# Patient Record
Sex: Male | Born: 1960 | Race: White | Hispanic: No | Marital: Married | Smoking: Current every day smoker
Health system: Southern US, Community
[De-identification: ages and names within clinical notes are randomized; demographics above are authoritative.]

## PROBLEM LIST (undated history)

## (undated) DIAGNOSIS — F32A Depression, unspecified: Secondary | ICD-10-CM

## (undated) DIAGNOSIS — F329 Major depressive disorder, single episode, unspecified: Secondary | ICD-10-CM

## (undated) DIAGNOSIS — F419 Anxiety disorder, unspecified: Secondary | ICD-10-CM

## (undated) DIAGNOSIS — K5792 Diverticulitis of intestine, part unspecified, without perforation or abscess without bleeding: Secondary | ICD-10-CM

## (undated) HISTORY — DX: Major depressive disorder, single episode, unspecified: F32.9

## (undated) HISTORY — PX: WISDOM TOOTH EXTRACTION: SHX21

## (undated) HISTORY — DX: Depression, unspecified: F32.A

## (undated) HISTORY — DX: Anxiety disorder, unspecified: F41.9

---

## 2008-10-12 ENCOUNTER — Emergency Department: Payer: Self-pay | Admitting: Emergency Medicine

## 2012-01-17 DIAGNOSIS — M542 Cervicalgia: Secondary | ICD-10-CM | POA: Insufficient documentation

## 2012-01-17 DIAGNOSIS — G4700 Insomnia, unspecified: Secondary | ICD-10-CM | POA: Insufficient documentation

## 2014-07-15 DIAGNOSIS — Z87891 Personal history of nicotine dependence: Secondary | ICD-10-CM | POA: Insufficient documentation

## 2014-07-15 DIAGNOSIS — F32A Depression, unspecified: Secondary | ICD-10-CM | POA: Insufficient documentation

## 2014-07-15 DIAGNOSIS — F329 Major depressive disorder, single episode, unspecified: Secondary | ICD-10-CM | POA: Insufficient documentation

## 2015-03-31 ENCOUNTER — Other Ambulatory Visit: Payer: Self-pay | Admitting: Family Medicine

## 2015-03-31 DIAGNOSIS — R1084 Generalized abdominal pain: Secondary | ICD-10-CM

## 2015-03-31 DIAGNOSIS — K529 Noninfective gastroenteritis and colitis, unspecified: Secondary | ICD-10-CM

## 2015-04-02 ENCOUNTER — Ambulatory Visit
Admission: RE | Admit: 2015-04-02 | Discharge: 2015-04-02 | Disposition: A | Discharge status: Home / Self Care | Payer: Managed Care, Other (non HMO) | Source: Ambulatory Visit | Attending: Family Medicine | Admitting: Family Medicine

## 2015-04-02 DIAGNOSIS — K529 Noninfective gastroenteritis and colitis, unspecified: Secondary | ICD-10-CM

## 2015-04-02 DIAGNOSIS — R1084 Generalized abdominal pain: Secondary | ICD-10-CM

## 2015-04-07 ENCOUNTER — Ambulatory Visit
Admission: RE | Admit: 2015-04-07 | Discharge: 2015-04-07 | Disposition: A | Discharge status: Home / Self Care | Payer: Managed Care, Other (non HMO) | Source: Ambulatory Visit | Attending: Family Medicine | Admitting: Family Medicine

## 2015-04-07 DIAGNOSIS — R1084 Generalized abdominal pain: Secondary | ICD-10-CM | POA: Diagnosis not present

## 2015-04-07 DIAGNOSIS — R197 Diarrhea, unspecified: Secondary | ICD-10-CM | POA: Diagnosis present

## 2016-04-06 ENCOUNTER — Encounter: Payer: Self-pay | Admitting: Emergency Medicine

## 2016-04-06 ENCOUNTER — Ambulatory Visit
Admission: EM | Admit: 2016-04-06 | Discharge: 2016-04-06 | Disposition: A | Discharge status: Home / Self Care | Payer: Managed Care, Other (non HMO) | Attending: Family Medicine | Admitting: Family Medicine

## 2016-04-06 ENCOUNTER — Ambulatory Visit (INDEPENDENT_AMBULATORY_CARE_PROVIDER_SITE_OTHER): Payer: Managed Care, Other (non HMO)

## 2016-04-06 DIAGNOSIS — Z8719 Personal history of other diseases of the digestive system: Secondary | ICD-10-CM

## 2016-04-06 DIAGNOSIS — R1030 Lower abdominal pain, unspecified: Secondary | ICD-10-CM

## 2016-04-06 HISTORY — DX: Diverticulitis of intestine, part unspecified, without perforation or abscess without bleeding: K57.92

## 2016-04-06 LAB — COMPREHENSIVE METABOLIC PANEL
ALT: 15 U/L — AB (ref 17–63)
AST: 14 U/L — AB (ref 15–41)
Albumin: 4.5 g/dL (ref 3.5–5.0)
Alkaline Phosphatase: 46 U/L (ref 38–126)
Anion gap: 7 (ref 5–15)
BILIRUBIN TOTAL: 1.1 mg/dL (ref 0.3–1.2)
BUN: 18 mg/dL (ref 6–20)
CO2: 22 mmol/L (ref 22–32)
CREATININE: 0.86 mg/dL (ref 0.61–1.24)
Calcium: 9.2 mg/dL (ref 8.9–10.3)
Chloride: 107 mmol/L (ref 101–111)
Glucose, Bld: 96 mg/dL (ref 65–99)
Potassium: 4 mmol/L (ref 3.5–5.1)
Sodium: 136 mmol/L (ref 135–145)
TOTAL PROTEIN: 7 g/dL (ref 6.5–8.1)

## 2016-04-06 LAB — CBC WITH DIFFERENTIAL/PLATELET
BASOS ABS: 0.1 10*3/uL (ref 0–0.1)
BASOS PCT: 1 %
EOS ABS: 0.2 10*3/uL (ref 0–0.7)
EOS PCT: 2 %
HCT: 48.9 % (ref 40.0–52.0)
HEMOGLOBIN: 16.8 g/dL (ref 13.0–18.0)
Lymphocytes Relative: 23 %
Lymphs Abs: 2.4 10*3/uL (ref 1.0–3.6)
MCH: 31.4 pg (ref 26.0–34.0)
MCHC: 34.4 g/dL (ref 32.0–36.0)
MCV: 91.3 fL (ref 80.0–100.0)
Monocytes Absolute: 0.8 10*3/uL (ref 0.2–1.0)
Monocytes Relative: 8 %
NEUTROS PCT: 66 %
Neutro Abs: 7 10*3/uL — ABNORMAL HIGH (ref 1.4–6.5)
PLATELETS: 152 10*3/uL (ref 150–440)
RBC: 5.36 MIL/uL (ref 4.40–5.90)
RDW: 13.1 % (ref 11.5–14.5)
WBC: 10.5 10*3/uL (ref 3.8–10.6)

## 2016-04-06 LAB — AMYLASE: AMYLASE: 39 U/L (ref 28–100)

## 2016-04-06 LAB — LIPASE, BLOOD: LIPASE: 25 U/L (ref 11–51)

## 2016-04-06 MED ORDER — CIPROFLOXACIN HCL 500 MG PO TABS: 500.0000 mg | ORAL_TABLET | Freq: Two times a day (BID) | ORAL | 0 refills | Status: DC

## 2016-04-06 MED ORDER — METRONIDAZOLE 500 MG PO TABS: 500.0000 mg | ORAL_TABLET | Freq: Two times a day (BID) | ORAL | 0 refills | Status: DC

## 2016-04-06 NOTE — ED Provider Notes (Signed)
[facility]    CSN: [id number] Arrival date & time: 04/06/16  8295     History   Chief Complaint Chief Complaint  Patient presents with  . Abdominal Pain    HPI Alexander Berg is a 55 y.o. male.   Patient is here because of history of abdominal pain started on Monday. States that by Wednesday he thought that things were improving by Thursday he had a relapse. He was driving this morning to work and felt increased pain and discomfort and came here. He states that about a 1-2 years ago he was diagnosed with diverticulitis. He's had MRIs of the imaging studies of the abdomen and a colonoscopy and that was the conclusion given. He has been on Cipro and Flagyl before combined in the past but states the most at times and acute diverticulitis clears on his own if he changes his diet and eats Poss and soft foods for a while. He tried that this time it did not work. Today he's had numerous numbers of stools and increased pain. He states the pain has gotten better since arrival here and he is sitting. He is allergic to melons he does smoke. His father died of pancreatic cancer.   The history is provided by the patient. No language interpreter was used.  Abdominal Pain  Pain location:  Suprapubic and LLQ Pain quality: aching, cramping, pressure and sharp   Pain radiates to:  Does not radiate Pain severity:  Moderate Duration:  4 days Timing:  Constant Progression:  Worsening Context: diet changes and recent illness   Relieved by:  Nothing Ineffective treatments: dietary changes. Associated symptoms: diarrhea   Associated symptoms: no fever     Past Medical History:  Diagnosis Date  . Diverticulitis     There are no active problems to display for this patient.   No past surgical history on file.     Home Medications    Prior to Admission medications   Medication Sig Start Date End Date Taking? Authorizing Provider  ciprofloxacin (CIPRO) 500 MG tablet Take 1  tablet (500 mg total) by mouth 2 (two) times daily. 04/06/16   Hassan Rowan, MD  metroNIDAZOLE (FLAGYL) 500 MG tablet Take 1 tablet (500 mg total) by mouth 2 (two) times daily. 04/06/16   Hassan Rowan, MD    Family History History reviewed. No pertinent family history.  Social History Social History  Substance Use Topics  . Smoking status: Current Every Day Smoker    Packs/day: 0.50    Types: Cigarettes  . Smokeless tobacco: Never Used  . Alcohol use Yes     Comment: occassional      Allergies   Review of patient's allergies indicates no known allergies.   Review of Systems Review of Systems  Constitutional: Negative for fever.  Gastrointestinal: Positive for abdominal pain and diarrhea.  All other systems reviewed and are negative.    Physical Exam Triage Vital Signs ED Triage Vitals [04/06/16 0932]  Enc Vitals Group     BP 128/79     Pulse Rate 69     Resp 16     Temp 97.7 F (36.5 C)     Temp Source Oral     SpO2 98 %     Weight 235 lb (106.6 kg)     Height 6\' 2"  (1.88 m)     Head Circumference      Peak Flow      Pain Score      Pain Loc  Pain Edu?      Excl. in GC?    No data found.   Updated Vital Signs BP 128/79 (BP Location: Right Arm)   Pulse 69   Temp 97.7 F (36.5 C) (Oral)   Resp 16   Ht 6\' 2"  (1.88 m)   Wt 235 lb (106.6 kg)   SpO2 98%   BMI 30.17 kg/m   Visual Acuity Right Eye Distance:   Left Eye Distance:   Bilateral Distance:    Right Eye Near:   Left Eye Near:    Bilateral Near:     Physical Exam  Constitutional: He is oriented to person, place, and time. He appears well-developed and well-nourished.  HENT:  Head: Normocephalic and atraumatic.  Right Ear: External ear normal.  Left Ear: External ear normal.  Eyes: Pupils are equal, round, and reactive to light.  Neck: Normal range of motion. Neck supple.  Cardiovascular: Exam reveals distant heart sounds.   Pulmonary/Chest: Effort normal and breath sounds normal.    Abdominal: Soft. He exhibits distension. Bowel sounds are increased. There is no hepatosplenomegaly. There is tenderness. There is no guarding. No hernia.    Musculoskeletal: Normal range of motion.  Neurological: He is alert and oriented to person, place, and time.  Skin: Skin is warm and dry.  Psychiatric: He has a normal mood and affect.     UC Treatments / Results  Labs (all labs ordered are listed, but only abnormal results are displayed) Labs Reviewed  COMPREHENSIVE METABOLIC PANEL - Abnormal; Notable for the following:       Result Value   AST 14 (*)    ALT 15 (*)    All other components within normal limits  CBC WITH DIFFERENTIAL/PLATELET - Abnormal; Notable for the following:    Neutro Abs 7.0 (*)    All other components within normal limits  LIPASE, BLOOD  AMYLASE    EKG  EKG Interpretation None       Radiology Dg Abd Acute W/chest  Result Date: 04/06/2016 CLINICAL DATA:  Lower abdominal pain for 2 days with diarrhea and cramping. Initial encounter. EXAM: DG ABDOMEN ACUTE W/ 1V CHEST COMPARISON:  None. FINDINGS: Single-view of the chest demonstrates clear lungs and normal heart size. Aortic atherosclerosis is noted. No pneumothorax or pleural effusion. Two views of the abdomen show no free intraperitoneal air. The bowel gas pattern is nonobstructive. Prominent stool in the ascending colon is noted. No abnormal abdominal calcification or focal bony abnormality is identified. IMPRESSION: No acute finding chest or abdomen. Atherosclerosis. Prominent stool burden ascending colon. Electronically Signed   By: Drusilla Kanner M.D.   On: 04/06/2016 10:48    Procedures Procedures (including critical care time)  Medications Ordered in UC Medications - No data to display   Initial Impression / Assessment and Plan / UC Course  I have reviewed the triage vital signs and the nursing notes.  Pertinent labs & imaging results that were available during my care of the  patient were reviewed by me and considered in my medical decision making (see chart for details).   Results for orders placed or performed during the hospital encounter of 04/06/16  Lipase, blood  Result Value Ref Range   Lipase 25 11 - 51 U/L  Amylase  Result Value Ref Range   Amylase 39 28 - 100 U/L  Comprehensive metabolic panel  Result Value Ref Range   Sodium 136 135 - 145 mmol/L   Potassium 4.0 3.5 - 5.1 mmol/L  Chloride 107 101 - 111 mmol/L   CO2 22 22 - 32 mmol/L   Glucose, Bld 96 65 - 99 mg/dL   BUN 18 6 - 20 mg/dL   Creatinine, Ser 8.110.86 0.61 - 1.24 mg/dL   Calcium 9.2 8.9 - 91.410.3 mg/dL   Total Protein 7.0 6.5 - 8.1 g/dL   Albumin 4.5 3.5 - 5.0 g/dL   AST 14 (L) 15 - 41 U/L   ALT 15 (L) 17 - 63 U/L   Alkaline Phosphatase 46 38 - 126 U/L   Total Bilirubin 1.1 0.3 - 1.2 mg/dL   GFR calc non Af Amer >60 >60 mL/min   GFR calc Af Amer >60 >60 mL/min   Anion gap 7 5 - 15  CBC with Differential  Result Value Ref Range   WBC 10.5 3.8 - 10.6 K/uL   RBC 5.36 4.40 - 5.90 MIL/uL   Hemoglobin 16.8 13.0 - 18.0 g/dL   HCT 78.248.9 95.640.0 - 21.352.0 %   MCV 91.3 80.0 - 100.0 fL   MCH 31.4 26.0 - 34.0 pg   MCHC 34.4 32.0 - 36.0 g/dL   RDW 08.613.1 57.811.5 - 46.914.5 %   Platelets 152 150 - 440 K/uL   Neutrophils Relative % 66 %   Neutro Abs 7.0 (H) 1.4 - 6.5 K/uL   Lymphocytes Relative 23 %   Lymphs Abs 2.4 1.0 - 3.6 K/uL   Monocytes Relative 8 %   Monocytes Absolute 0.8 0.2 - 1.0 K/uL   Eosinophils Relative 2 %   Eosinophils Absolute 0.2 0 - 0.7 K/uL   Basophils Relative 1 %   Basophils Absolute 0.1 0 - 0.1 K/uL   Clinical Course   Patient's X-ray,And labs looked satisfactory. No acute findings other than some constipation. Will treat with Flagyl and Cipro with understanding that if his abdominal pain gets worse he'll need to go to the ED. He declined Zofran and Toradol injection when he was here   Final Clinical Impressions(s) / UC Diagnoses   Final diagnoses:  Lower abdominal pain    Hx of diverticulitis of colon    New Prescriptions New Prescriptions   CIPROFLOXACIN (CIPRO) 500 MG TABLET    Take 1 tablet (500 mg total) by mouth 2 (two) times daily.   METRONIDAZOLE (FLAGYL) 500 MG TABLET    Take 1 tablet (500 mg total) by mouth 2 (two) times daily.     Hassan RowanEugene Frady Taddeo, MD 04/06/16 1134

## 2016-04-06 NOTE — ED Triage Notes (Signed)
Pt reports abd cramping nausea and diarrhea reports similar symptoms previously with diverticulitis.

## 2016-04-09 ENCOUNTER — Telehealth: Payer: Self-pay | Admitting: *Deleted

## 2016-04-09 NOTE — Telephone Encounter (Signed)
Courtesy call back, verified DOB, patient reported feeling better. 

## 2017-03-19 DIAGNOSIS — Z86010 Personal history of colonic polyps: Secondary | ICD-10-CM | POA: Insufficient documentation

## 2017-04-02 ENCOUNTER — Encounter: Payer: Self-pay | Admitting: Psychiatry

## 2017-04-02 ENCOUNTER — Ambulatory Visit (INDEPENDENT_AMBULATORY_CARE_PROVIDER_SITE_OTHER): Payer: Commercial Managed Care - PPO | Admitting: Psychiatry

## 2017-04-02 VITALS — BP 131/77 | HR 86 | Temp 97.9°F | Wt 209.4 lb

## 2017-04-02 DIAGNOSIS — F411 Generalized anxiety disorder: Secondary | ICD-10-CM | POA: Diagnosis not present

## 2017-04-02 DIAGNOSIS — F1021 Alcohol dependence, in remission: Secondary | ICD-10-CM | POA: Diagnosis not present

## 2017-04-02 DIAGNOSIS — F330 Major depressive disorder, recurrent, mild: Secondary | ICD-10-CM

## 2017-04-02 MED ORDER — CITALOPRAM HYDROBROMIDE 10 MG PO TABS: 5.0000 mg | ORAL_TABLET | Freq: Every day | ORAL | 1 refills | Status: DC

## 2017-04-02 MED ORDER — PROPRANOLOL HCL 10 MG PO TABS: 10.0000 mg | ORAL_TABLET | Freq: Two times a day (BID) | ORAL | 1 refills | Status: AC | PRN

## 2017-04-02 NOTE — Progress Notes (Signed)
Psychiatric Initial Adult Assessment   Patient Identification: Alexander Berg MRN:  [id number] Date of Evaluation:  04/02/2017 Referral Source: Dr.Vickey Ether GriffinsFowler  Chief Complaint:  " My wife wanted me to see a doctor."  Chief Complaint    Establish Care; Anxiety; Depression     Visit Diagnosis:    ICD-10-CM   1. MDD (major depressive disorder), recurrent episode, mild (HCC) F33.0 citalopram (CELEXA) 10 MG tablet  2. Generalized anxiety disorder F41.1 propranolol (INDERAL) 10 MG tablet  3. 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  Alexander Berg presents today as calm , cooperative. Alexander Berg reports that lately he has been noticing some worsening mood sx. Alexander Berg reports anhedonia , feeling sad, sleep issues , poor appetite , trouble concentrating as well as some withdrawal from friends and family. Alexander Berg reports that this has been worsening since the past few weeks or so. He reports some anxiety sx - performance anxiety, anxiety about project presentation and so on. He does report some physical sensation of anxiety like racing heart rate , restlessness and so on. He reports that he has always struggled with sleep. His sleep is still broken , but he tends to get 6 hrs or so and he is OK with that for now.  He reports work as extra stressful. 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   He enjoys his family, finds to spend time with his wife , has set goals for retirement , has pet dogs that he enjoys taking care of .  He does have diverticulitis - he struggles with it on and off , has been OK since the past 3 months or so.  He does report a hx of alcohol and cannabis abuse . He drank heavy for 15 years - until he turned 31 or so and then he quit , has been sober  ever since.     Associated Signs/Symptoms: Depression Symptoms:  depressed mood, anhedonia, psychomotor retardation, fatigue, feelings of worthlessness/guilt, difficulty concentrating, anxiety, panic attacks, (Hypo) Manic Symptoms:  Distractibility, Anxiety Symptoms:  Excessive Worry, Panic Symptoms, Psychotic Symptoms:  denies PTSD Symptoms: Negative  Past Psychiatric History: Reports a hx of mood sx in the past - his PMD gave him prozac 2 yrs ago. He stopped it after 4 months since he had a side effect of feeling extreme sensitivity on his skin when someone touched him. Denies IP admissions, suicide attempts.   Previous Psychotropic Medications: Yes - prozac  Substance Abuse History in the last 12 months:  No. alcohol abuse in remission ( 25 yrs ago) , cannabis abuse ( 1980)   Consequences of Substance Abuse: Negative  Past Medical History:  Past Medical History:  Diagnosis Date  . Anxiety   . Depression   . Diverticulitis     Past Surgical History:  Procedure Laterality Date  . WISDOM TOOTH EXTRACTION      Family Psychiatric History:Denies hx of mental illness in family, denies substance abuse, suicide in family  Family History:  Family History  Problem Relation Age of Onset  . Pancreatic cancer Father   . Mental illness Neg Hx     Social History:   Social History   Social History  . Marital status: Married  Spouse name: N/A  . Number of children: N/A  . Years of education: N/A   Social History Main Topics  . Smoking status: Current Every Day Smoker    Packs/day: 0.50    Types: Cigarettes  . Smokeless tobacco: Never Used  . Alcohol use 0.6 oz/week    1 Cans of beer per week     Comment: occassional   . Drug use: No  . Sexual activity: Not Asked   Other Topics Concern  . None   Social History Narrative  . None    Additional Social History: Born in [place], raised by both parents. Married twice, divorced once, has been in this  marriage since the past 21 yrs or so. Has 4 sons- two biological and two step. They are all adults, doing well. Got an associate degree in Geographical information systems officer and has been working as an Art gallery manager at [employer]. Denies legal issues, denies being in [employer].  Allergies:   Allergies  Allergen Reactions  . Citrullus Vulgaris Swelling    Throat closes    Metabolic Disorder Labs: No results found for: HGBA1C, MPG No results found for: PROLACTIN No results found for: CHOL, TRIG, HDL, CHOLHDL, VLDL, LDLCALC   Current Medications: Current Outpatient Prescriptions  Medication Sig Dispense Refill  . citalopram (CELEXA) 10 MG tablet Take 0.5 tablets (5 mg total) by mouth daily. 15 tablet 1  . propranolol (INDERAL) 10 MG tablet Take 1 tablet (10 mg total) by mouth 2 (two) times daily as needed (severe anxiety attacks). 45 tablet 1   No current facility-administered medications for this visit.     Neurologic: Headache: No Seizure: No Paresthesias:No  Musculoskeletal: Strength & Muscle Tone: within normal limits Gait & Station: normal Patient leans: N/A  Psychiatric Specialty Exam: Review of Systems  Psychiatric/Behavioral: Positive for depression. The patient is nervous/anxious.   All other systems reviewed and are negative.   Blood pressure 131/77, pulse 86, temperature 97.9 F (36.6 C), temperature source Oral, weight 209 lb 6.4 oz (95 kg).Body mass index is 26.89 kg/m.  General Appearance: Casual  Eye Contact:  Fair  Speech:  Normal Rate  Volume:  Normal  Mood:  Anxious and Dysphoric  Affect:  Appropriate  Thought Process:  Goal Directed and Descriptions of Associations: Intact  Orientation:  Full (Time, Place, and Person)  Thought Content:  Rumination  Suicidal Thoughts:  No  Homicidal Thoughts:  No  Memory:  Immediate;   Fair Recent;   Fair Remote;   Fair  Judgement:  Fair  Insight:  Fair  Psychomotor Activity:  Normal  Concentration:  Concentration: Fair and Attention Span:  Fair  Recall:  [employer] of Knowledge:Fair  Language: Fair  Akathisia:  No  Handed:  Right  AIMS (if indicated):  NA  Assets:  Communication Skills Desire for Improvement Financial Resources/Insurance Housing Intimacy Physical Health Resilience Social Support Talents/Skills Transportation Vocational/Educational  ADL's:  Intact  Cognition: WNL  Sleep:varies    Treatment Plan Summary:Ramiz is a 65 y old CM who is married , presented with worsening depressive and anxiety sx. Derryl has several stressors at work and that has been making his mood sx worse. Calven has good social support , denies substance abuse, denies family hx of mental illness. He is a good candidate for outpatient therapy. Plan as noted below. Medication management and Plan as noted below.   Depression: Celexa 5 mg po daily. PHQ-9- 10  Anxiety sx: Celexa 5 mg po daily. Propranolol 10 mg po bid  prn for breakthrough anxiety sx. GAD - 7 - 10   Alcohol abuse in remission: Has not used in 25 yrs or more .  Provided hand outs on medications - discussed side effects.  Discussed to continue leisure activities.  Discussed sleep hygiene - he reports he does not want anything for sleep now.  Will get records from his PMD with his consent.  Follow up in 4 weeks or sooner if needed.   Jomarie Longs, MD 10/22/20185:09 PM

## 2017-04-02 NOTE — Patient Instructions (Signed)
Citalopram tablets What is this medicine? CITALOPRAM (sye TAL oh pram) is a medicine for depression. This medicine may be used for other purposes; ask your health care provider or pharmacist if you have questions. COMMON BRAND NAME(S): Celexa What should I tell my health care provider before I take this medicine? They need to know if you have any of these conditions: -bleeding disorders -bipolar disorder or a family history of bipolar disorder -glaucoma -heart disease -history of irregular heartbeat -kidney disease -liver disease -low levels of magnesium or potassium in the blood -receiving electroconvulsive therapy -seizures -suicidal thoughts, plans, or attempt; a previous suicide attempt by you or a family member -take medicines that treat or prevent blood clots -thyroid disease -an unusual or allergic reaction to citalopram, escitalopram, other medicines, foods, dyes, or preservatives -pregnant or trying to become pregnant -breast-feeding How should I use this medicine? Take this medicine by mouth with a glass of water. Follow the directions on the prescription label. You can take it with or without food. Take your medicine at regular intervals. Do not take your medicine more often than directed. Do not stop taking this medicine suddenly except upon the advice of your doctor. Stopping this medicine too quickly may cause serious side effects or your condition may worsen. A special MedGuide will be given to you by the pharmacist with each prescription and refill. Be sure to read this information carefully each time. Talk to your pediatrician regarding the use of this medicine in children. Special care may be needed. Patients over 60 years old may have a stronger reaction and need a smaller dose. Overdosage: If you think you have taken too much of this medicine contact a poison control center or emergency room at once. NOTE: This medicine is only for you. Do not share this medicine with  others. What if I miss a dose? If you miss a dose, take it as soon as you can. If it is almost time for your next dose, take only that dose. Do not take double or extra doses. What may interact with this medicine? Do not take this medicine with any of the following medications: -certain medicines for fungal infections like fluconazole, itraconazole, ketoconazole, posaconazole, voriconazole -cisapride -dofetilide -dronedarone -escitalopram -linezolid -MAOIs like Carbex, Eldepryl, Marplan, Nardil, and Parnate -methylene blue (injected into a vein) -pimozide -thioridazine -ziprasidone This medicine may also interact with the following medications: -alcohol -amphetamines -aspirin and aspirin-like medicines -carbamazepine -certain medicines for depression, anxiety, or psychotic disturbances -certain medicines for infections like chloroquine, clarithromycin, erythromycin, furazolidone, isoniazid, pentamidine -certain medicines for migraine headaches like almotriptan, eletriptan, frovatriptan, naratriptan, rizatriptan, sumatriptan, zolmitriptan -certain medicines for sleep -certain medicines that treat or prevent blood clots like dalteparin, enoxaparin, warfarin -cimetidine -diuretics -fentanyl -lithium -methadone -metoprolol -NSAIDs, medicines for pain and inflammation, like ibuprofen or naproxen -omeprazole -other medicines that prolong the QT interval (cause an abnormal heart rhythm) -procarbazine -rasagiline -supplements like St. John's wort, kava kava, valerian -tramadol -tryptophan This list may not describe all possible interactions. Give your health care provider a list of all the medicines, herbs, non-prescription drugs, or dietary supplements you use. Also tell them if you smoke, drink alcohol, or use illegal drugs. Some items may interact with your medicine. What should I watch for while using this medicine? Tell your doctor if your symptoms do not get better or if they  get worse. Visit your doctor or health care professional for regular checks on your progress. Because it may take several weeks to see the full   effects of this medicine, it is important to continue your treatment as prescribed by your doctor. Patients and their families should watch out for new or worsening thoughts of suicide or depression. Also watch out for sudden changes in feelings such as feeling anxious, agitated, panicky, irritable, hostile, aggressive, impulsive, severely restless, overly excited and hyperactive, or not being able to sleep. If this happens, especially at the beginning of treatment or after a change in dose, call your health care professional. Bonita Quin may get drowsy or dizzy. Do not drive, use machinery, or do anything that needs mental alertness until you know how this medicine affects you. Do not stand or sit up quickly, especially if you are an older patient. This reduces the risk of dizzy or fainting spells. Alcohol may interfere with the effect of this medicine. Avoid alcoholic drinks. Your mouth may get dry. Chewing sugarless gum or sucking hard candy, and drinking plenty of water will help. Contact your doctor if the problem does not go away or is severe. What side effects may I notice from receiving this medicine? Side effects that you should report to your doctor or health care professional as soon as possible: -allergic reactions like skin rash, itching or hives, swelling of the face, lips, or tongue -anxious -black, tarry stools -breathing problems -changes in vision -chest pain -confusion -elevated mood, decreased need for sleep, racing thoughts, impulsive behavior -eye pain -fast, irregular heartbeat -feeling faint or lightheaded, falls -feeling agitated, angry, or irritable -hallucination, loss of contact with reality -loss of balance or coordination -loss of memory -painful or prolonged erections -restlessness, pacing, inability to keep  still -seizures -stiff muscles -suicidal thoughts or other mood changes -trouble sleeping -unusual bleeding or bruising -unusually weak or tired -vomiting Side effects that usually do not require medical attention (report to your doctor or health care professional if they continue or are bothersome): -change in appetite or weight -change in sex drive or performance -dizziness -headache -increased sweating -indigestion, nausea -tremors This list may not describe all possible side effects. Call your doctor for medical advice about side effects. You may report side effects to FDA at 1-800-FDA-1088. Where should I keep my medicine? Keep out of reach of children. Store at room temperature between 15 and 30 degrees C (59 and 86 degrees F). Throw away any unused medicine after the expiration date. NOTE: This sheet is a summary. It may not cover all possible information. If you have questions about this medicine, talk to your doctor, pharmacist, or health care provider.  2018 Elsevier/Gold Standard (2015-11-01 13:18:52) Propranolol tablets What is this medicine? PROPRANOLOL (proe PRAN oh lole) is a beta-blocker. Beta-blockers reduce the workload on the heart and help it to beat more regularly. This medicine is used to treat high blood pressure, to control irregular heart rhythms (arrhythmias) and to relieve chest pain caused by angina. It may also be helpful after a heart attack. This medicine is also used to prevent migraine headaches, relieve uncontrollable shaking (tremors), and help certain problems related to the thyroid gland and adrenal gland. This medicine may be used for other purposes; ask your health care provider or pharmacist if you have questions. COMMON BRAND NAME(S): Inderal What should I tell my health care provider before I take this medicine? They need to know if you have any of these conditions: -circulation problems or blood vessel disease -diabetes -history of heart  attack or heart disease, vasospastic angina -kidney disease -liver disease -lung or breathing disease, like asthma or emphysema -pheochromocytoma -  slow heart rate -thyroid disease -an unusual or allergic reaction to propranolol, other beta-blockers, medicines, foods, dyes, or preservatives -pregnant or trying to get pregnant -breast-feeding How should I use this medicine? Take this medicine by mouth with a glass of water. Follow the directions on the prescription label. Take your doses at regular intervals. Do not take your medicine more often than directed. Do not stop taking except on your the advice of your doctor or health care professional. Talk to your pediatrician regarding the use of this medicine in children. Special care may be needed. Overdosage: If you think you have taken too much of this medicine contact a poison control center or emergency room at once. NOTE: This medicine is only for you. Do not share this medicine with others. What if I miss a dose? If you miss a dose, take it as soon as you can. If it is almost time for your next dose, take only that dose. Do not take double or extra doses. What may interact with this medicine? Do not take this medicine with any of the following medications: -feverfew -phenothiazines like chlorpromazine, mesoridazine, prochlorperazine, thioridazine This medicine may also interact with the following medications: -aluminum hydroxide gel -antipyrine -antiviral medicines for HIV or AIDS -barbiturates like phenobarbital -certain medicines for blood pressure, heart disease, irregular heart beat -cimetidine -ciprofloxacin -diazepam -fluconazole -haloperidol -isoniazid -medicines for cholesterol like cholestyramine or colestipol -medicines for mental depression -medicines for migraine headache like almotriptan, eletriptan, frovatriptan, naratriptan, rizatriptan, sumatriptan, zolmitriptan -NSAIDs, medicines for pain and inflammation, like  ibuprofen or naproxen -phenytoin -rifampin -teniposide -theophylline -thyroid medicines -tolbutamide -warfarin -zileuton This list may not describe all possible interactions. Give your health care provider a list of all the medicines, herbs, non-prescription drugs, or dietary supplements you use. Also tell them if you smoke, drink alcohol, or use illegal drugs. Some items may interact with your medicine. What should I watch for while using this medicine? Visit your doctor or health care professional for regular check ups. Check your blood pressure and pulse rate regularly. Ask your health care professional what your blood pressure and pulse rate should be, and when you should contact them. You may get drowsy or dizzy. Do not drive, use machinery, or do anything that needs mental alertness until you know how this drug affects you. Do not stand or sit up quickly, especially if you are an older patient. This reduces the risk of dizzy or fainting spells. Alcohol can make you more drowsy and dizzy. Avoid alcoholic drinks. This medicine can affect blood sugar levels. If you have diabetes, check with your doctor or health care professional before you change your diet or the dose of your diabetic medicine. Do not treat yourself for coughs, colds, or pain while you are taking this medicine without asking your doctor or health care professional for advice. Some ingredients may increase your blood pressure. What side effects may I notice from receiving this medicine? Side effects that you should report to your doctor or health care professional as soon as possible: -allergic reactions like skin rash, itching or hives, swelling of the face, lips, or tongue -breathing problems -changes in blood sugar -cold hands or feet -difficulty sleeping, nightmares -dry peeling skin -hallucinations -muscle cramps or weakness -slow heart rate -swelling of the legs and ankles -vomiting Side effects that usually do  not require medical attention (report to your doctor or health care professional if they continue or are bothersome): -change in sex drive or performance -diarrhea -dry sore eyes -  hair loss -nausea -weak or tired This list may not describe all possible side effects. Call your doctor for medical advice about side effects. You may report side effects to FDA at 1-800-FDA-1088. Where should I keep my medicine? Keep out of the reach of children. Store at room temperature between 15 and 30 degrees C (59 and 86 degrees F). Protect from light. Throw away any unused medicine after the expiration date. NOTE: This sheet is a summary. It may not cover all possible information. If you have questions about this medicine, talk to your doctor, pharmacist, or health care provider.  2018 Elsevier/Gold Standard (2013-01-31 14:51:53)

## 2017-06-22 ENCOUNTER — Other Ambulatory Visit: Payer: Self-pay

## 2017-06-22 ENCOUNTER — Encounter: Payer: Self-pay | Admitting: Psychiatry

## 2017-06-22 ENCOUNTER — Ambulatory Visit (INDEPENDENT_AMBULATORY_CARE_PROVIDER_SITE_OTHER): Payer: Commercial Managed Care - PPO | Admitting: Psychiatry

## 2017-06-22 VITALS — BP 154/87 | HR 76 | Temp 97.8°F | Wt 210.8 lb

## 2017-06-22 DIAGNOSIS — F17200 Nicotine dependence, unspecified, uncomplicated: Secondary | ICD-10-CM

## 2017-06-22 DIAGNOSIS — F411 Generalized anxiety disorder: Secondary | ICD-10-CM

## 2017-06-22 DIAGNOSIS — F330 Major depressive disorder, recurrent, mild: Secondary | ICD-10-CM | POA: Diagnosis not present

## 2017-06-22 DIAGNOSIS — F1021 Alcohol dependence, in remission: Secondary | ICD-10-CM | POA: Diagnosis not present

## 2017-06-22 MED ORDER — CITALOPRAM HYDROBROMIDE 10 MG PO TABS: 10.0000 mg | ORAL_TABLET | Freq: Every day | ORAL | 2 refills | Status: DC

## 2017-06-22 NOTE — Progress Notes (Signed)
BH MD  OP Progress Note  06/22/2017 12:10 PM Alexander Berg  MRN:  [id number]  Chief Complaint: ' I am ok."  Chief Complaint    Follow-up; Medication Refill     HPI: Alexander Berg is a 56 year old Caucasian male who is married, lives in [place]  , employed, referred to our clinic  by his primary medical doctor for worsening anxiety and depressive symptoms he is here today for a follow-up visit.  Alexander Berg today reports that he has been improving with the current medication.  He reports his anxiety is better as well as he is less depressed and less withdrawn and his concentration has improved.  He reports his wife has noticed a change in him and is very happy about that.  He has been taking the medication Celexa 5 mg however 2 days ago he ran out and feels a little bit anxious without the medications today.  He denies any side effects to the medication.  He reports that the first few days he had some GI issues but then it resolved.  Alexander Berg reports he had a good time with his family and friends during the holidays.  He reports his work as better since he works in a different setting now.  He however reports that he will be transferred back to his previous department in 2-3 weeks and he is not looking forward to that.  He reports he has started applying for a transfer to this current department where he works and is hoping that will happen.  His wife also started a new job and is very happy about her job.  That also decreased a lot of stress at home .  He continues to walk his dogs for 30-40 minutes after he gets back home from work.  He reports he also cooks and does other things that relaxes him.  He continues to smoke cigarettes, he has been trying his best to cut down.  He reports he sleeps 5.5 to 6 hours at night however he wakes up at 3:30 AM or so and then his sleep is kind of interrupted.  He however reports that he does not feel tired or drowsy during the day .  He reports his sleep issues could be  because he uses caffeine, drinks Coke close to bedtime and also may be due to him trying to cut down on his smoking cigarettes.  He reports he is not ready to try a sleep medication yet.    Visit Diagnosis:    ICD-10-CM   1. MDD (major depressive disorder), recurrent episode, mild (HCC) F33.0 citalopram (CELEXA) 10 MG tablet  2. 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 GAD (generalized anxiety disorder) F41.1   4. Tobacco use disorder F17.200     Past Psychiatric History: Reports a history of mood symptoms in the past-his PMD gave him Prozac 2 years ago.  He stopped it after 4 months since he had a side effect of feeling extreme sensitivity on his skin when someone touched him.  Denies IP admissions, denies suicide attempts.  Past Medical History:  Past Medical History:  Diagnosis Date  . Anxiety   . Depression   . Diverticulitis     Past Surgical History:  Procedure Laterality Date  . WISDOM TOOTH EXTRACTION      Family Psychiatric History: Denies history of mental illness in family, denies substance abuse, suicide in family.  Family History:  Family History  Problem Relation Age of Onset  .  Pancreatic cancer Father   . Mental illness Neg Hx    Substance abuse history: He used to abuse alcohol in the past-  has been in remission since the past 25 years or so, cannabis abuse in 1980.  Social History: Born n [place], raised by both parents.  Married twice, divorced once, has been in this marriage  since the past 21 years or so.  Has 4 sons-2 biological and 2 step.  They are all adults, doing well.  Got an associate degree in Geographical information systems officer and has been working as an Art gallery manager at [employer].  Denies legal issues, denies being in [employer] Social History   Socioeconomic History  . Marital status: Married    Spouse name: None  . Number of children: None  . Years of education: None  . Highest education level: None  Social Needs  . Financial  resource strain: None  . Food insecurity - worry: None  . Food insecurity - inability: None  . Transportation needs - medical: None  . Transportation needs - non-medical: None  Occupational History  . None  Tobacco Use  . Smoking status: Current Every Day Smoker    Packs/day: 0.50    Types: Cigarettes  . Smokeless tobacco: Never Used  Substance and Sexual Activity  . Alcohol use: Yes    Alcohol/week: 0.6 oz    Types: 1 Cans of beer per week    Comment: occassional   . Drug use: No  . Sexual activity: None  Other Topics Concern  . None  Social History Narrative  . None    Allergies:  Allergies  Allergen Reactions  . Citrullus Vulgaris Swelling    Throat closes    Metabolic Disorder Labs: No results found for: HGBA1C, MPG No results found for: PROLACTIN No results found for: CHOL, TRIG, HDL, CHOLHDL, VLDL, LDLCALC No results found for: TSH  Therapeutic Level Labs: No results found for: LITHIUM No results found for: VALPROATE No components found for:  CBMZ  Current Medications: Current Outpatient Medications  Medication Sig Dispense Refill  . citalopram (CELEXA) 10 MG tablet Take 1 tablet (10 mg total) by mouth daily. 30 tablet 2  . propranolol (INDERAL) 10 MG tablet Take 1 tablet (10 mg total) by mouth 2 (two) times daily as needed (severe anxiety attacks). 45 tablet 1   No current facility-administered medications for this visit.      Musculoskeletal: Strength & Muscle Tone: within normal limits Gait & Station: normal Patient leans: N/A  Psychiatric Specialty Exam: Review of Systems  Psychiatric/Behavioral: Positive for depression. The patient is nervous/anxious.   All other systems reviewed and are negative.   Blood pressure (!) 154/87, pulse 76, temperature 97.8 F (36.6 C), temperature source Oral, weight 210 lb 12.8 oz (95.6 kg).Body mass index is 27.07 kg/m.  General Appearance: Casual  Eye Contact:  Fair  Speech:  Clear and Coherent  Volume:   Normal  Mood:  Anxious  Affect:  Congruent  Thought Process:  Goal Directed and Descriptions of Associations: Intact  Orientation:  Full (Time, Place, and Person)  Thought Content: Logical   Suicidal Thoughts:  No  Homicidal Thoughts:  No  Memory:  Immediate;   Fair Recent;   Fair Remote;   Fair  Judgement:  Fair  Insight:  Fair  Psychomotor Activity:  Normal  Concentration:  Concentration: Fair and Attention Span: Fair  Recall:  [employer] of Knowledge: Fair  Language: Fair  Akathisia:  No  Handed:  Right  AIMS (if indicated):NA  Assets:  Communication Skills Desire for Improvement Financial Resources/Insurance Housing Intimacy Leisure Time Physical Health Resilience Social Support Talents/Skills Transportation Vocational/Educational  ADL's:  Intact  Cognition: WNL  Sleep:  Fair   Screenings: GAD 7, PHQ 9   Assessment and Plan: Alexander Berg is a 57 year old Caucasian male who is married, presented today for a follow-up visit.  He has a history of depression, anxiety and  is currently doing well on the Celexa.  He ran out of his Celexa couple of days ago however reports that he does not have any significant withdrawal symptoms  other than feeling a bit anxious today.  He continues to have good social support, denies any substance abuse, denies family history of mental illness.  He is motivated to stay on his medications.  Plan as noted below.  For depression Increase Celexa to 10 mg p.o. Daily.  Patient prefers to do so. PHQ 9 equals 5  For anxiety symptoms increase Increase Celexa to 10 mg p.o. daily, patient prefers to do so Continue propranolol 10 mg p.o. twice daily as needed for breakthrough anxiety symptoms GAD 7 equals 7  Alcohol abuse in remission Remission > 25 years  Continue to monitor  Tobacco use disorder  He continues to work on cutting down Provided handouts, smoking cessation counseling.  For elevated blood pressure He reports it may be due to his  anxiety and not taking Celexa for the past 2 days or so. He said he will monitor it closely  Discussed sleep hygiene techniques, provided handouts.  Follow-up in 2-3 months or sooner if needed  More than 50 % of the time was spent for psychoeducation and supportive psychotherapy and care coordination.  This note was generated in part or whole with voice recognition software. Voice recognition is usually quite accurate but there are transcription errors that can and very often do occur. I apologize for any typographical errors that were not detected and corrected.        Jomarie LongsSaramma Tiphany Fayson, MD 06/22/2017, 12:10 PM

## 2017-06-22 NOTE — Patient Instructions (Signed)
Coping with Quitting Smoking Quitting smoking is a physical and mental challenge. You will face cravings, withdrawal symptoms, and temptation. Before quitting, work with your health care provider to make a plan that can help you cope. Preparation can help you quit and keep you from giving in. How can I cope with cravings? Cravings usually last for 5-10 minutes. If you get through it, the craving will pass. Consider taking the following actions to help you cope with cravings:  Keep your mouth busy: ? Chew sugar-free gum. ? Suck on hard candies or a straw. ? Brush your teeth.  Keep your hands and body busy: ? Immediately change to a different activity when you feel a craving. ? Squeeze or play with a ball. ? Do an activity or a hobby, like making bead jewelry, practicing needlepoint, or working with wood. ? Mix up your normal routine. ? Take a short exercise break. Go for a quick walk or run up and down stairs. ? Spend time in public places where smoking is not allowed.  Focus on doing something kind or helpful for someone else.  Call a friend or family member to talk during a craving.  Join a support group.  Call a quit line, such as 1-800-QUIT-NOW.  Talk with your health care provider about medicines that might help you cope with cravings and make quitting easier for you.  How can I deal with withdrawal symptoms? Your body may experience negative effects as it tries to get used to not having nicotine in the system. These effects are called withdrawal symptoms. They may include:  Feeling hungrier than normal.  Trouble concentrating.  Irritability.  Trouble sleeping.  Feeling depressed.  Restlessness and agitation.  Craving a cigarette.  To manage withdrawal symptoms:  Avoid places, people, and activities that trigger your cravings.  Remember why you want to quit.  Get plenty of sleep.  Avoid coffee and other caffeinated drinks. These may worsen some of your  symptoms.  How can I handle social situations? Social situations can be difficult when you are quitting smoking, especially in the first few weeks. To manage this, you can:  Avoid parties, bars, and other social situations where people might be smoking.  Avoid alcohol.  Leave right away if you have the urge to smoke.  Explain to your family and friends that you are quitting smoking. Ask for understanding and support.  Plan activities with friends or family where smoking is not an option.  What are some ways I can cope with stress? Wanting to smoke may cause stress, and stress can make you want to smoke. Find ways to manage your stress. Relaxation techniques can help. For example:  Breathe slowly and deeply, in through your nose and out through your mouth.  Listen to soothing, relaxing music.  Talk with a family member or friend about your stress.  Light a candle.  Soak in a bath or take a shower.  Think about a peaceful place.  What are some ways I can prevent weight gain? Be aware that many people gain weight after they quit smoking. However, not everyone does. To keep from gaining weight, have a plan in place before you quit and stick to the plan after you quit. Your plan should include:  Having healthy snacks. When you have a craving, it may help to: ? Eat plain popcorn, crunchy carrots, celery, or other cut vegetables. ? Chew sugar-free gum.  Changing how you eat: ? Eat small portion sizes at meals. ?   Eat 4-6 small meals throughout the day instead of 1-2 large meals a day. ? Be mindful when you eat. Do not watch television or do other things that might distract you as you eat.  Exercising regularly: ? Make time to exercise each day. If you do not have time for a long workout, do short bouts of exercise for 5-10 minutes several times a day. ? Do some form of strengthening exercise, like weight lifting, and some form of aerobic exercise, like running or  swimming.  Drinking plenty of water or other low-calorie or no-calorie drinks. Drink 6-8 glasses of water daily, or as much as instructed by your health care provider.  Summary  Quitting smoking is a physical and mental challenge. You will face cravings, withdrawal symptoms, and temptation to smoke again. Preparation can help you as you go through these challenges.  You can cope with cravings by keeping your mouth busy (such as by chewing gum), keeping your body and hands busy, and making calls to family, friends, or a helpline for people who want to quit smoking.  You can cope with withdrawal symptoms by avoiding places where people smoke, avoiding drinks with caffeine, and getting plenty of rest.  Ask your health care provider about the different ways to prevent weight gain, avoid stress, and handle social situations. This information is not intended to replace advice given to you by your health care provider. Make sure you discuss any questions you have with your health care provider. Document Released: 05/26/2016 Document Revised: 05/26/2016 Document Reviewed: 05/26/2016 Elsevier Interactive Patient Education  2018 [employer]  

## 2017-08-27 IMAGING — CR DG ABDOMEN ACUTE W/ 1V CHEST
5 series · 5 of 5 positions shown · non-contrast
Comparison: None.

CLINICAL DATA: Lower abdominal pain for 2 days with diarrhea and
cramping. Initial encounter.

EXAM:
DG ABDOMEN ACUTE W/ 1V CHEST

[chest pa]
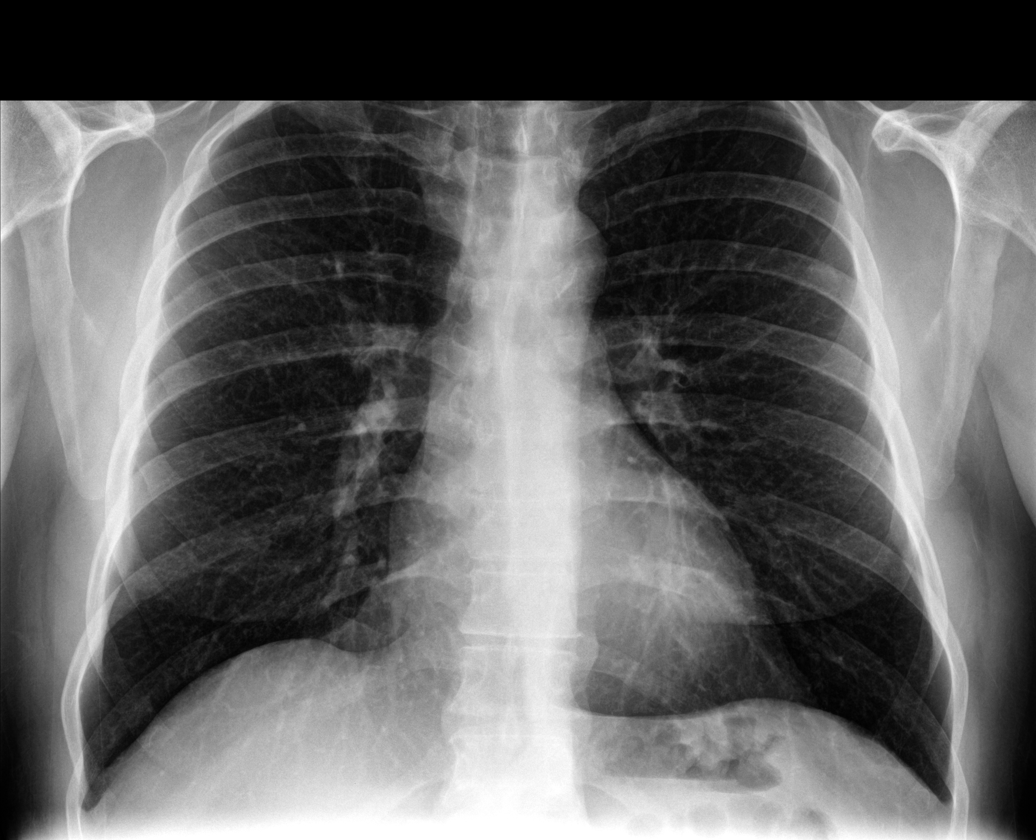

[abdomen erect (1 of 2)]
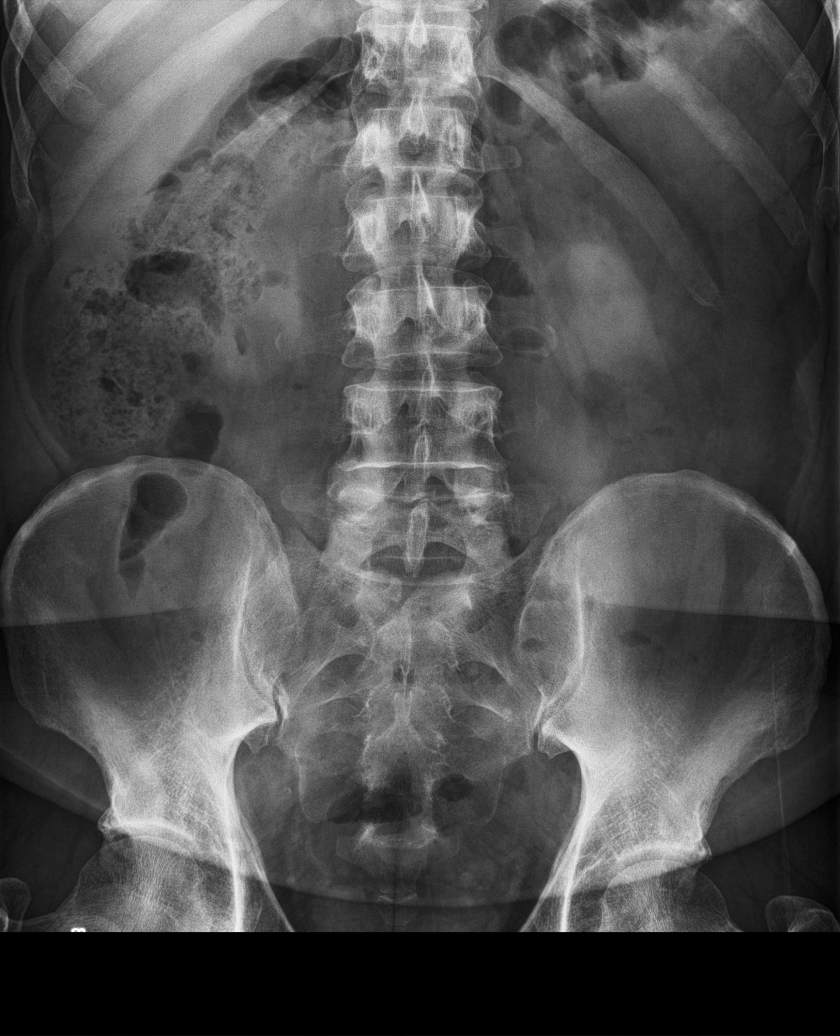

[abdomen erect (2 of 2)]
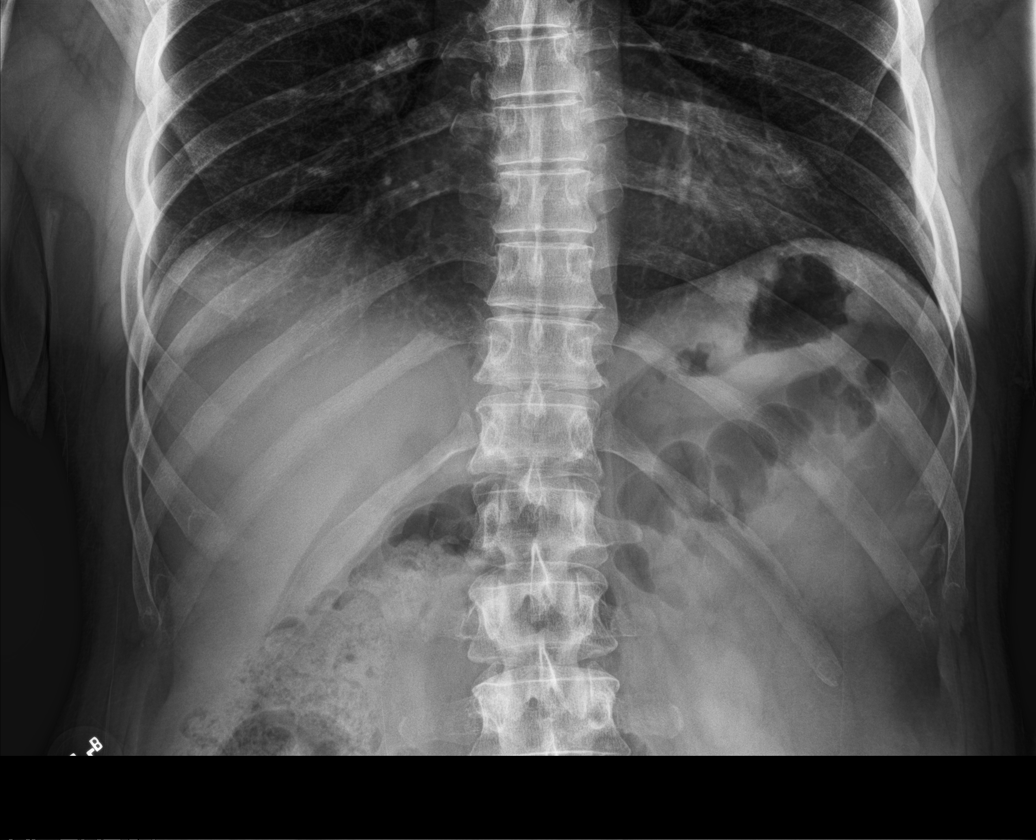

[abdomen supine (1 of 2)]
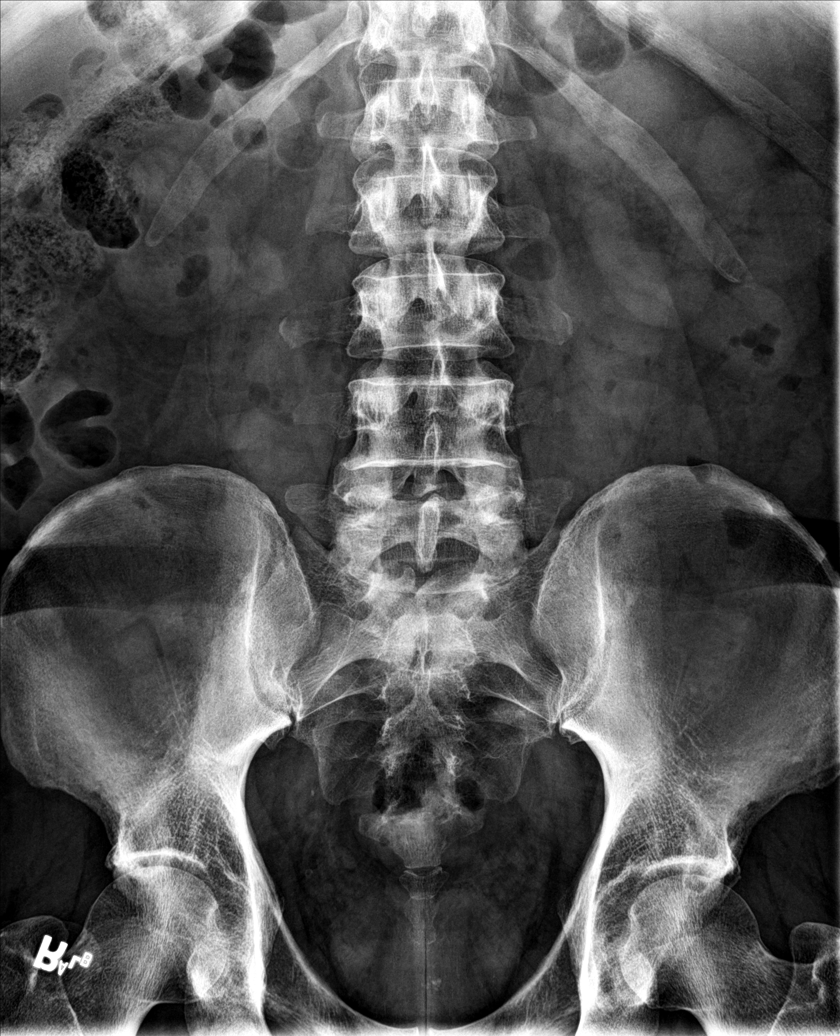

[abdomen supine (2 of 2)]
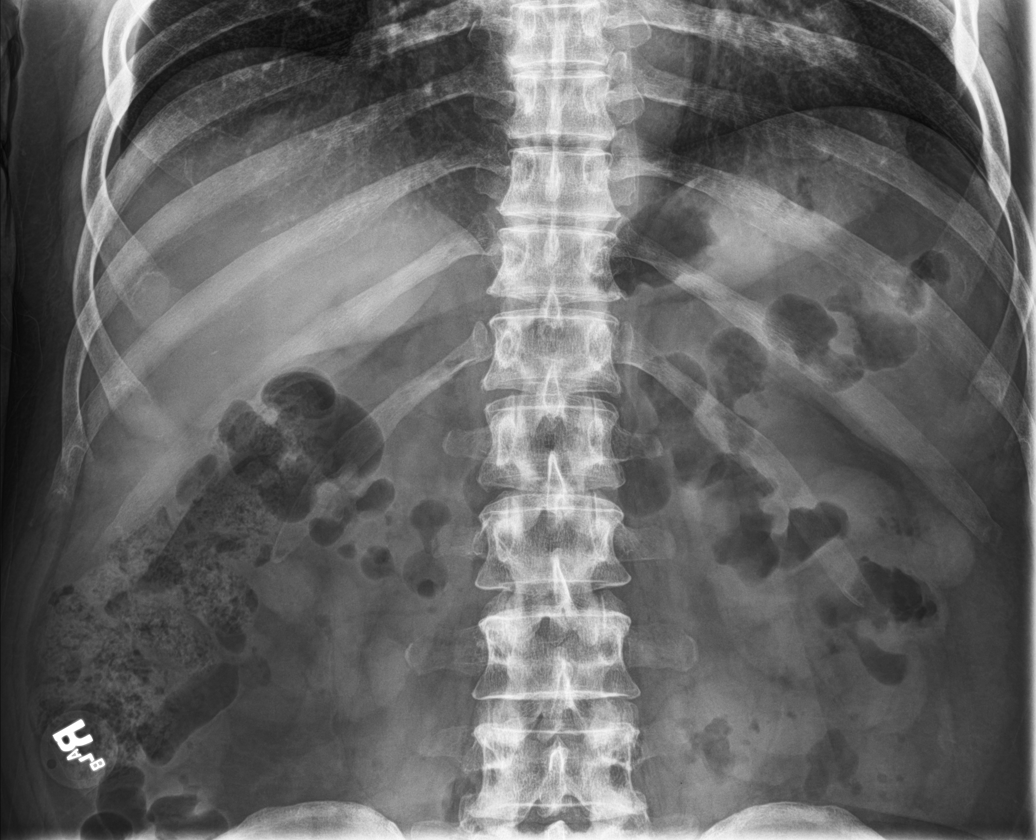

[5 of 5 positions shown; findings below may reference images not displayed]

FINDINGS: Single-view of the chest demonstrates clear lungs and normal heart
size. Aortic atherosclerosis is noted. No pneumothorax or pleural
effusion.

Two views of the abdomen show no free intraperitoneal air. The bowel
gas pattern is nonobstructive. Prominent stool in the ascending
colon is noted. No abnormal abdominal calcification or focal bony
abnormality is identified.
IMPRESSION: No acute finding chest or abdomen.

Atherosclerosis.

Prominent stool burden ascending colon.

## 2017-09-20 ENCOUNTER — Encounter: Payer: Self-pay | Admitting: Psychiatry

## 2017-09-20 ENCOUNTER — Other Ambulatory Visit: Payer: Self-pay

## 2017-09-20 ENCOUNTER — Ambulatory Visit: Payer: Commercial Managed Care - PPO | Admitting: Psychiatry

## 2017-09-20 VITALS — BP 125/84 | HR 78 | Temp 98.8°F | Wt 217.2 lb

## 2017-09-20 DIAGNOSIS — F17200 Nicotine dependence, unspecified, uncomplicated: Secondary | ICD-10-CM | POA: Diagnosis not present

## 2017-09-20 DIAGNOSIS — F330 Major depressive disorder, recurrent, mild: Secondary | ICD-10-CM

## 2017-09-20 DIAGNOSIS — F1021 Alcohol dependence, in remission: Secondary | ICD-10-CM | POA: Diagnosis not present

## 2017-09-20 DIAGNOSIS — F411 Generalized anxiety disorder: Secondary | ICD-10-CM | POA: Diagnosis not present

## 2017-09-20 MED ORDER — CITALOPRAM HYDROBROMIDE 10 MG PO TABS: 10.0000 mg | ORAL_TABLET | Freq: Every day | ORAL | 3 refills | Status: AC

## 2017-09-20 NOTE — Progress Notes (Signed)
BH MD OP Progress Note  09/20/2017 8:56 AM Alexander Berg  MRN:  [id number]  Chief Complaint: ' I am here for follow up." Chief Complaint    Follow-up; Medication Refill     HPI: Alexander Berg is a 57 year old Caucasian male who is married, lives in [place], employed, referred to the clinic by his primary medical doctor for worsening anxiety and depressive symptoms.  Patient today is here for a follow-up visit.  Alexander Berg today reports the last few days has been stressful for him.  He reports his beagles jumped the fence and ran away. He reports that made him anxious and restless.  He however reports he has been able to cope with this anxiety better than before.  He reports he has been taking walks for 1-1-1/2 hours/day.  He also has been cooking.  He reports he is able to focus on things outside of work when he is home.    He reports he has been taking the Celexa with dinner in the evening.  He tried taking it in the morning and that gave him GI symptoms.  Taking it this way has been more helpful for him.  He has not noticed any side effects anymore once he change the dosing time.  He reports he has been sleeping fair.  There are some nights when he is restless.  He reports he has pinged nerve at the back of his neck which can be painful.  He reports that sometimes keeps him awake.  He however does not feel his sleep issue is something concerning.  He does not want to be on any medications at this time.  He reports he has tried medications like melatonin and trazodone in the past.  He reports he will let writer know if he changes his mind.  He continues to smoke cigarettes.  He reports he is trying to cut down.  He continues to want to work on it himself.  He does not want any medications yet. Visit Diagnosis:    ICD-10-CM   1. MDD (major depressive disorder), recurrent episode, mild (HCC) F33.0 citalopram (CELEXA) 10 MG tablet  2. 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 GAD (generalized anxiety disorder) F41.1   4. Tobacco use disorder F17.200     Past Psychiatric History: Reports a history of mood symptoms in the past-his PMD gave him Prozac 2 years ago.  He stopped it after 4 months since he had a side effect of feeling extreme sensitivity to his skin when someone touched him.  Denies IP admission, denies suicide attempts.  Past Medical History:  Past Medical History:  Diagnosis Date  . Anxiety   . Depression   . Diverticulitis     Past Surgical History:  Procedure Laterality Date  . WISDOM TOOTH EXTRACTION      Family Psychiatric History: Denies history of mental illness in family, denies substance abuse, suicide in family.  Family History:  Family History  Problem Relation Age of Onset  . Pancreatic cancer Father   . 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  Social History: Born in [place].  Raised by both parents.  Married twice, divorced once, has been in this marriage since the past 21 years or so.  Has 4 sons-2 biological and 2 stepsons.  They are all adults, doing well.  Got an  associate degree in Geographical information systems officer and has been working as an Art gallery manager at [employer].  Denies legal issues, denies being in [employer]. Social History   Socioeconomic History  . Marital status: Married    Spouse name: Not on file  . Number of children: Not on file  . Years of education: Not on file  . Highest education level: Not on file  Occupational History  . Not on file  Social Needs  . Financial resource strain: Not on file  . Food insecurity:    Worry: Not on file    Inability: Not on file  . Transportation needs:    Medical: Not on file    Non-medical: Not on file  Tobacco Use  . Smoking status: Current Every Day Smoker    Packs/day: 0.50    Types: Cigarettes  . Smokeless tobacco: Never Used  Substance and Sexual  Activity  . Alcohol use: Yes    Alcohol/week: 0.6 oz    Types: 1 Cans of beer per week    Comment: occassional   . Drug use: No  . Sexual activity: Not on file  Lifestyle  . Physical activity:    Days per week: Not on file    Minutes per session: Not on file  . Stress: Not on file  Relationships  . Social connections:    Talks on phone: Not on file    Gets together: Not on file    Attends religious service: Not on file    Active member of club or organization: Not on file    Attends meetings of clubs or organizations: Not on file    Relationship status: Not on file  Other Topics Concern  . Not on file  Social History Narrative  . Not on file    Allergies:  Allergies  Allergen Reactions  . Citrullus Vulgaris Swelling    Throat closes    Metabolic Disorder Labs: No results found for: HGBA1C, MPG No results found for: PROLACTIN No results found for: CHOL, TRIG, HDL, CHOLHDL, VLDL, LDLCALC No results found for: TSH  Therapeutic Level Labs: No results found for: LITHIUM No results found for: VALPROATE No components found for:  CBMZ  Current Medications: Current Outpatient Medications  Medication Sig Dispense Refill  . citalopram (CELEXA) 10 MG tablet Take 1 tablet (10 mg total) by mouth daily. 30 tablet 3  . propranolol (INDERAL) 10 MG tablet Take 1 tablet (10 mg total) by mouth 2 (two) times daily as needed (severe anxiety attacks). 45 tablet 1   No current facility-administered medications for this visit.      Musculoskeletal: Strength & Muscle Tone: within normal limits Gait & Station: normal Patient leans: N/A  Psychiatric Specialty Exam: Review of Systems  Psychiatric/Behavioral: Positive for depression (improving). The patient is nervous/anxious (improving).   All other systems reviewed and are negative.   Blood pressure 125/84, pulse 78, temperature 98.8 F (37.1 C), temperature source Oral, weight 217 lb 3.2 oz (98.5 kg).Body mass index is 27.89  kg/m.  General Appearance: Casual  Eye Contact:  Fair  Speech:  Clear and Coherent  Volume:  Normal  Mood:  Anxious  Affect:  Congruent  Thought Process:  Goal Directed and Descriptions of Associations: Intact  Orientation:  Full (Time, Place, and Person)  Thought Content: Logical   Suicidal Thoughts:  No  Homicidal Thoughts:  No  Memory:  Immediate;   Fair Recent;   Fair Remote;   Fair  Judgement:  Fair  Insight:  Fair  Psychomotor Activity:  Normal  Concentration:  Concentration: Fair and Attention Span: Fair  Recall:  FiservFair  Fund of Knowledge: Fair  Language: Fair  Akathisia:  No  Handed:  Right  AIMS (if indicated): na  Assets:  Communication Skills Desire for Improvement Financial Resources/Insurance Housing Intimacy Leisure Time Physical Health Resilience Social Support Talents/Skills Transportation Vocational/Educational  ADL's:  Intact  Cognition: WNL  Sleep:  Fair   Screenings:phq 9, gad 7   Assessment and Plan: [name] is a 57 year old Caucasian male who is married, presented today for a follow-up visit.  He has a history of depression, anxiety.  [name] is currently doing well on the current dose of medication.  He does have on and off anxiety symptoms however he is coping better.  He continues to be employed.  He continues to have good social support.  Discussed referral for CBT.  Patient declines at this time.  Plan as noted below.  For depression-improving Continue Celexa 10 mg p.o. daily. PHQ 9 equals 5  For anxiety symptoms-improving Continue Celexa 10 mg p.o. daily Continue propranolol 10 mg p.o. twice daily as needed GAD 7 equals 6  Alcohol abuse in remission Patient continues to stay sober. He has been in remission since more than 25 years.  Tobacco use disorder Provided smoking cessation counseling again.  He is trying to quit.  He declines medications offered.  Patient reports his symptoms as improved and would like to see writer less  frequently.  We will see him in 5-6 months for a follow-up.  Discussed with him that if his symptoms worsens or if he wants to be seen sooner to call clinic to make a sooner appointment.  More than 50 % of the time was spent for psychoeducation and supportive psychotherapy and care coordination.  This note was generated in part or whole with voice recognition software. Voice recognition is usually quite accurate but there are transcription errors that can and very often do occur. I apologize for any typographical errors that were not detected and corrected.       Jomarie LongsSaramma Nonnie Pickney, MD 09/20/2017, 8:56 AM

## 2018-03-22 ENCOUNTER — Ambulatory Visit: Payer: Commercial Managed Care - PPO | Admitting: Psychiatry
